# Patient Record
Sex: Male | Born: 1955 | Race: White | Hispanic: No | Smoking: Former smoker
Health system: Southern US, Community
[De-identification: ages and names within clinical notes are randomized; demographics above are authoritative.]

---

## 2016-10-06 ENCOUNTER — Emergency Department (HOSPITAL_COMMUNITY)
Admission: EM | Admit: 2016-10-06 | Discharge: 2016-10-06 | Disposition: A | Discharge status: Home / Self Care | Payer: BLUE CROSS/BLUE SHIELD | Attending: Emergency Medicine | Admitting: Emergency Medicine

## 2016-10-06 ENCOUNTER — Encounter (HOSPITAL_COMMUNITY): Payer: Self-pay

## 2016-10-06 ENCOUNTER — Emergency Department (HOSPITAL_COMMUNITY): Payer: BLUE CROSS/BLUE SHIELD

## 2016-10-06 DIAGNOSIS — R1084 Generalized abdominal pain: Secondary | ICD-10-CM | POA: Insufficient documentation

## 2016-10-06 DIAGNOSIS — Z87891 Personal history of nicotine dependence: Secondary | ICD-10-CM | POA: Insufficient documentation

## 2016-10-06 DIAGNOSIS — R112 Nausea with vomiting, unspecified: Secondary | ICD-10-CM | POA: Diagnosis not present

## 2016-10-06 LAB — CBC
HEMATOCRIT: 40.6 % (ref 39.0–52.0)
HEMOGLOBIN: 13.5 g/dL (ref 13.0–17.0)
MCH: 29 pg (ref 26.0–34.0)
MCHC: 33.3 g/dL (ref 30.0–36.0)
MCV: 87.1 fL (ref 78.0–100.0)
Platelets: 224 10*3/uL (ref 150–400)
RBC: 4.66 MIL/uL (ref 4.22–5.81)
RDW: 12.6 % (ref 11.5–15.5)
WBC: 6.6 10*3/uL (ref 4.0–10.5)

## 2016-10-06 LAB — COMPREHENSIVE METABOLIC PANEL
ALBUMIN: 3.6 g/dL (ref 3.5–5.0)
ALT: 14 U/L — ABNORMAL LOW (ref 17–63)
ANION GAP: 5 (ref 5–15)
AST: 19 U/L (ref 15–41)
Alkaline Phosphatase: 67 U/L (ref 38–126)
BUN: 14 mg/dL (ref 6–20)
CHLORIDE: 111 mmol/L (ref 101–111)
CO2: 25 mmol/L (ref 22–32)
Calcium: 8.6 mg/dL — ABNORMAL LOW (ref 8.9–10.3)
Creatinine, Ser: 0.9 mg/dL (ref 0.61–1.24)
GFR calc Af Amer: >60 mL/min (ref >60)
GFR calc non Af Amer: >60 mL/min (ref >60)
GLUCOSE: 122 mg/dL — AB (ref 65–99)
POTASSIUM: 3.7 mmol/L (ref 3.5–5.1)
SODIUM: 141 mmol/L (ref 135–145)
Total Bilirubin: 0.5 mg/dL (ref 0.3–1.2)
Total Protein: 6.2 g/dL — ABNORMAL LOW (ref 6.5–8.1)

## 2016-10-06 LAB — URINALYSIS, ROUTINE W REFLEX MICROSCOPIC
Bilirubin Urine: NEGATIVE
Glucose, UA: NEGATIVE mg/dL
HGB URINE DIPSTICK: NEGATIVE
KETONES UR: NEGATIVE mg/dL
Nitrite: NEGATIVE
PROTEIN: NEGATIVE mg/dL
Specific Gravity, Urine: 1.026 (ref 1.005–1.030)
pH: 5 (ref 5.0–8.0)

## 2016-10-06 LAB — POC OCCULT BLOOD, ED: FECAL OCCULT BLD: NEGATIVE

## 2016-10-06 LAB — LIPASE, BLOOD: LIPASE: 29 U/L (ref 11–51)

## 2016-10-06 MED ORDER — PANTOPRAZOLE SODIUM 40 MG IV SOLR
40.0000 mg | Freq: Once | INTRAVENOUS | Status: AC
  Administered 2016-10-06: 40 mg via INTRAVENOUS
  Filled 2016-10-06: qty 40

## 2016-10-06 MED ORDER — ONDANSETRON 8 MG PO TBDP: 8.0000 mg | ORAL_TABLET | Freq: Three times a day (TID) | ORAL | 0 refills | Status: AC | PRN

## 2016-10-06 MED ORDER — IOPAMIDOL (ISOVUE-300) INJECTION 61%
INTRAVENOUS | Status: AC
  Administered 2016-10-06: 100 mL
  Filled 2016-10-06: qty 100

## 2016-10-06 MED ORDER — METOCLOPRAMIDE HCL 5 MG/ML IJ SOLN
10.0000 mg | Freq: Once | INTRAMUSCULAR | Status: AC
  Administered 2016-10-06: 10 mg via INTRAVENOUS
  Filled 2016-10-06: qty 2

## 2016-10-06 MED ORDER — FAMOTIDINE IN NACL 20-0.9 MG/50ML-% IV SOLN
20.0000 mg | Freq: Once | INTRAVENOUS | Status: AC
  Administered 2016-10-06: 20 mg via INTRAVENOUS
  Filled 2016-10-06: qty 50

## 2016-10-06 MED ORDER — OMEPRAZOLE 20 MG PO CPDR: 20.0000 mg | DELAYED_RELEASE_CAPSULE | Freq: Every day | ORAL | 0 refills | Status: AC

## 2016-10-06 MED ORDER — GI COCKTAIL ~~LOC~~
30.0000 mL | Freq: Once | ORAL | Status: AC
  Administered 2016-10-06: 30 mL via ORAL
  Filled 2016-10-06: qty 30

## 2016-10-06 NOTE — Discharge Instructions (Signed)
Your abdominal pain is likely from gastritis, reflux or a stomach ulcer. You will need to take the prescribed proton pump inhibitor as directed, and avoid spicy/fatty/acidic foods. Avoid laying down flat within 30 minutes of eating. Avoid NSAIDs like ibuprofen or Aleve on an empty stomach. Use zofran as needed for nausea. Follow up with the gastroenterologist (GI doctor) listed for ongoing evaluation of your abdominal pain. Return to the ER for new or worsening symptoms, any additional concers. ° ° °SEEK IMMEDIATE MEDICAL ATTENTION IF YOU DEVELOP ANY OF THE FOLLOWING SYMPTOMS: °The pain does not go away or becomes severe.  °A temperature above 101 develops.  °Repeated vomiting occurs (multiple episodes).  °Blood is being passed in stools or vomit (bright red or black tarry stools).  °Return also if you develop chest pain, difficulty breathing, dizziness or fainting ° °

## 2016-10-06 NOTE — ED Notes (Signed)
ED Provider at bedside. 

## 2016-10-06 NOTE — ED Triage Notes (Signed)
PT from home states that on Saturday he woke up with upset stomach and has been vomiting some brown, black emesis. Pt has not taken anything for the nausea.

## 2016-10-06 NOTE — ED Notes (Signed)
Awaiting GI consult/ Pt to remain NPO until GI consult.

## 2016-10-06 NOTE — ED Provider Notes (Signed)
[facility]-EMERGENCY DEPT Provider Note   CSN: [id number] Arrival date & time: 10/06/16  0865     History   Chief Complaint Chief Complaint  Patient presents with  . Emesis    HPI Logan Lawrence is a 61 y.o. male.  HPI 61 year old Caucasian male with no significant past medical history presents to the emergency Department today with complaints of coffee ground emesis. Patient states that over the weekend he ate hot dogs and hamburgers. States that yesterday he started developing generalized abdominal bloating and gas pains. States that he was taking over-the-counter gas medication with little relief. States that yesterday he had 5 or 6 episodes of emesis. States that it was dark brown to black in nature. Denies any frank red blood. Patient continues to endorse generalized bloating but denies any focal abdominal pain. Denies any emesis this a.m. Patient denies any frequent NSAID use, aspirin use. Denies any chronic alcohol use. Denies a history of H. pylori. Has never seen a GI doctor nor had a colonoscopy or endoscopy performed. Nothing makes the pain better or worse.   Pt denies any fever, chill, ha, vision changes, lightheadedness, dizziness, congestion, neck pain, cp, sob, cough, urinary symptoms, change in bowel habits, melena, hematochezia, lower extremity paresthesias.  History reviewed. No pertinent past medical history.  There are no active problems to display for this patient.   History reviewed. No pertinent surgical history.     Home Medications    Prior to Admission medications   Not on File    Family History History reviewed. No pertinent family history.  Social History Social History  Substance Use Topics  . Smoking status: Former Games developer  . Smokeless tobacco: Never Used     Comment: quit 1991  . Alcohol use No     Allergies   Patient has no allergy information on record.   Review of Systems Review of Systems  Constitutional: Negative for chills and fever.   HENT: Negative for congestion and sore throat.   Eyes: Negative for visual disturbance.  Respiratory: Negative for cough and shortness of breath.   Cardiovascular: Negative for chest pain.  Gastrointestinal: Positive for abdominal pain, nausea and vomiting. Negative for blood in stool, constipation and diarrhea.  Genitourinary: Negative for dysuria, flank pain, frequency, hematuria, scrotal swelling, testicular pain and urgency.  Musculoskeletal: Negative for arthralgias and myalgias.  Skin: Negative for rash.  Neurological: Negative for dizziness, syncope, weakness, light-headedness, numbness and headaches.  Psychiatric/Behavioral: Negative for sleep disturbance. The patient is not nervous/anxious.      Physical Exam Updated Vital Signs BP 120/83   Pulse 60   Temp 98.9 F (37.2 C) (Oral)   Resp 16   SpO2 97%   Physical Exam  Constitutional: He is oriented to person, place, and time. He appears well-developed and well-nourished.  Non-toxic appearance. No distress.  HENT:  Head: Normocephalic and atraumatic.  Mouth/Throat: Oropharynx is clear and moist.  Eyes: Pupils are equal, round, and reactive to light. Conjunctivae are normal. Right eye exhibits no discharge. Left eye exhibits no discharge.  Neck: Normal range of motion. Neck supple.  Cardiovascular: Normal rate, regular rhythm, normal heart sounds and intact distal pulses.  Exam reveals no gallop and no friction rub.   No murmur heard. Pulmonary/Chest: Effort normal and breath sounds normal. No respiratory distress. He has no wheezes. He has no rales. He exhibits no tenderness.  Abdominal: Soft. Bowel sounds are normal. He exhibits no distension. There is generalized tenderness. There is no rigidity, no rebound,  no guarding, no CVA tenderness, no tenderness at McBurney's point and negative Murphy's sign.  Genitourinary:  Genitourinary Comments: Chaperone present for exam. Pt tolerated without difficulty. No external  hemorrhoids or fissures noted. No pain with palpation of the rectal vault. No internal hemorrhoids noted. Soft brown stool noted in the rectal vault. No gross hematochezia or melena. Hemoccult neg.   Musculoskeletal: Normal range of motion. He exhibits no tenderness.  Lymphadenopathy:    He has no cervical adenopathy.  Neurological: He is alert and oriented to person, place, and time.  Skin: Skin is warm and dry. Capillary refill takes less than 2 seconds. No rash noted.  Psychiatric: His behavior is normal. Judgment and thought content normal.  Nursing note and vitals reviewed.    ED Treatments / Results  Labs (all labs ordered are listed, but only abnormal results are displayed) Labs Reviewed  CBC  LIPASE, BLOOD  COMPREHENSIVE METABOLIC PANEL  URINALYSIS, ROUTINE W REFLEX MICROSCOPIC  POC OCCULT BLOOD, ED    EKG  EKG Interpretation None       Radiology Ct Abdomen Pelvis W Contrast  Result Date: 10/06/2016 CLINICAL DATA:  Abdominal pain, nausea, vomiting and diarrhea for 2 days. EXAM: CT ABDOMEN AND PELVIS WITH CONTRAST TECHNIQUE: Multidetector CT imaging of the abdomen and pelvis was performed using the standard protocol following bolus administration of intravenous contrast. CONTRAST:  100 ml ISOVUE-300 IOPAMIDOL (ISOVUE-300) INJECTION 61% COMPARISON:  None. FINDINGS: Lower chest: Mild dependent atelectasis is seen in the lung bases. No pleural or pericardial effusion. There is thickening of the walls of the distal esophagus. Several small lymph nodes are present about the distal esophagus measuring up to 0.8 cm short axis dimension as seen on image 14. Hepatobiliary: No focal liver lesion or biliary ductal dilatation. The liver is mildly low attenuating consistent with fatty infiltration. The gallbladder appears normal. Pancreas: Unremarkable. No pancreatic ductal dilatation or surrounding inflammatory changes. Spleen: Normal in size without focal abnormality. Adrenals/Urinary  Tract: Adrenal glands are unremarkable. Kidneys are normal, without renal calculi, focal lesion, or hydronephrosis. Bladder is unremarkable. Stomach/Bowel: Stomach is within normal limits. Appendix appears normal. No evidence of bowel wall thickening, distention, or inflammatory changes. Vascular/Lymphatic: Negative for atherosclerosis. No pathologically enlarged lymph nodes in the abdomen or pelvis. Reproductive: Prostate is unremarkable. Other: No fluid collection. Small fat containing inguinal hernias are identified, larger on the left. Musculoskeletal: Negative. IMPRESSION: Thickening of the walls of the distal esophagus with small periesophageal lymph nodes may be due to inflammatory change but endoscopy is recommended to exclude neoplasm. Fatty infiltration of the liver. Electronically Signed   By: Drusilla Kannerhomas  Dalessio M.D.   On: 10/06/2016 09:00    Procedures Procedures (including critical care time)  Medications Ordered in ED Medications  famotidine (PEPCID) IVPB 20 mg premix (not administered)  pantoprazole (PROTONIX) injection 40 mg (not administered)  metoCLOPramide (REGLAN) injection 10 mg (not administered)     Initial Impression / Assessment and Plan / ED Course  I have reviewed the triage vital signs and the nursing notes.  Pertinent labs & imaging results that were available during my care of the patient were reviewed by me and considered in my medical decision making (see chart for details).     Patient presents to the ED with complaints of abdominal bloating, generalized abdominal pain, dark emesis. Patient symptoms have been ongoing for the past day. Patient denies any prior history of same.  On exam patient is overall well-appearing and nontoxic. Vital signs are reassuring. Patient  is afebrile, no tachycardia, hypotension.  Patient without any focal abdominal tenderness on exam. No signs of peritonitis. Physical exam is reassuring.  Patient's pain and other symptoms  adequately managed in emergency department.  Labs, imaging and vitals reviewed.  Hemoglobin, creatinine, BUN are normal. Normal lipase. UA shows no signs of infection. Hemoccult is negative.    Patient does not meet the SIRS or Sepsis criteria.  On repeat exam patient does not have a surgical abdomin and there are no peritoneal signs.  No indication of appendicitis, bowel obstruction, bowel perforation, cholecystitis, diverticulitis. Tolerating po fluids and food without difficulties.   CT scan was obtained due to coffee-ground emesis. Does note some distal esophageal wall thickening consistent with inflammation but cannot exclude neoplasm. Recommends endoscopy in the outpatient setting.  Spoke with Dr. Marca Ancona with [facility] concerning pt. she recommends patient be placed on PPI and outpatient follow-up endoscopy in the office.  Patient discharged home with symptomatic treatment and given strict instructions for follow-up with their primary care physician and GI.  I have also discussed reasons to return immediately to the ER.  Patient expresses understanding and agrees with plan.     Final Clinical Impressions(s) / ED Diagnoses   Final diagnoses:  Generalized abdominal pain  Non-intractable vomiting with nausea, unspecified vomiting type    New Prescriptions New Prescriptions   OMEPRAZOLE (PRILOSEC) 20 MG CAPSULE    Take 1 capsule (20 mg total) by mouth daily.   ONDANSETRON (ZOFRAN-ODT) 8 MG DISINTEGRATING TABLET    Take 1 tablet (8 mg total) by mouth every 8 (eight) hours as needed for nausea.     Rise Mu, PA-C 10/06/16 1003    Cathren Laine, MD 10/06/16 1024

## 2018-07-15 IMAGING — CT CT ABD-PELV W/ CM
2 of 5 series · 16 of 46 positions shown, 18 images · IV contrast (APPLIED)
Comparison: None.

CLINICAL DATA: Abdominal pain, nausea, vomiting and diarrhea for 2
days.

EXAM:
CT ABDOMEN AND PELVIS WITH CONTRAST
TECHNIQUE: Multidetector CT imaging of the abdomen and pelvis was performed
using the standard protocol following bolus administration of
intravenous contrast.
CONTRAST:  100 ml [id number] IOPAMIDOL (VAK0FI-B11) INJECTION 61%

[Series 3: abd/ pelvis 5.0 i30f 2 · axial · 0.82mm/px · z∈[+849,+1304]mm · 13 of 103 slices shown, 15 images]
[im 6/103  soft-tissue]
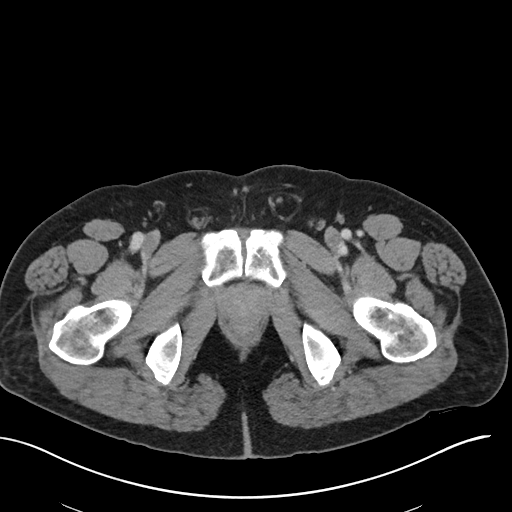
[im 6/103  bone]
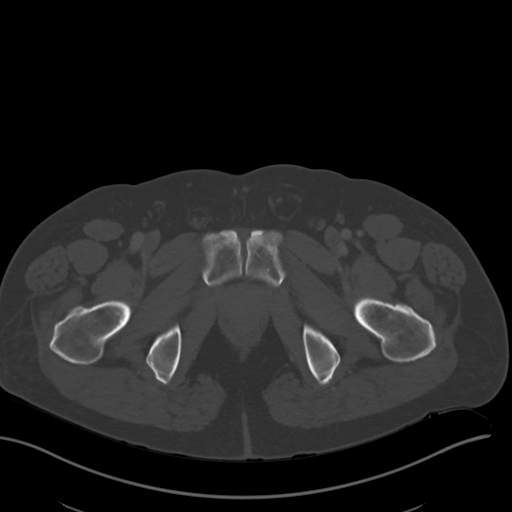
[im 12/103  soft-tissue]
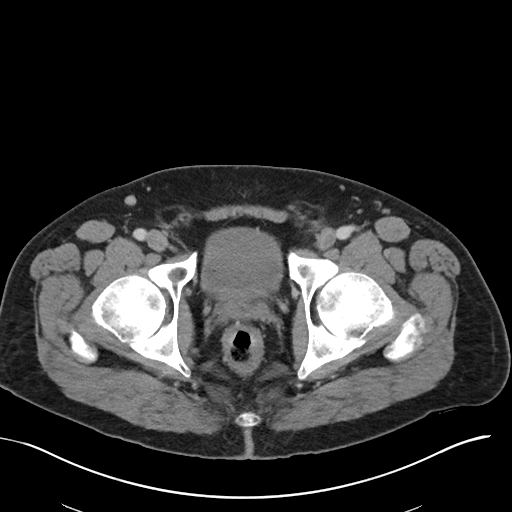
[im 23/103  soft-tissue]
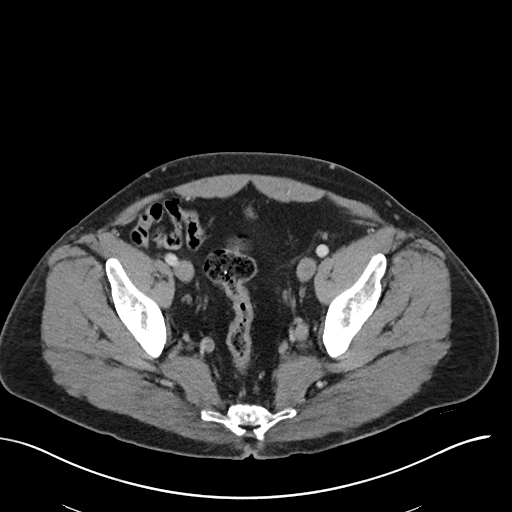
[im 29/103  soft-tissue]
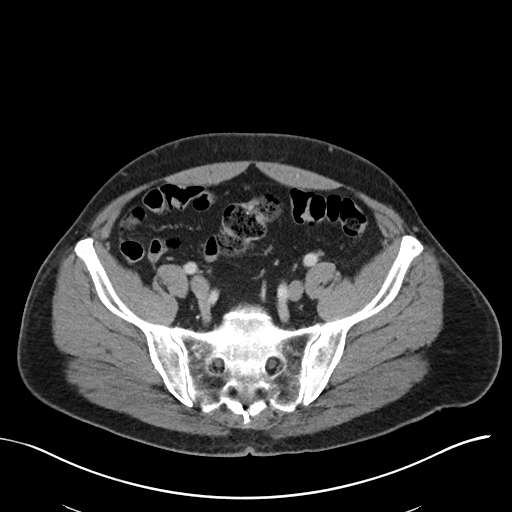
[im 35/103  soft-tissue]
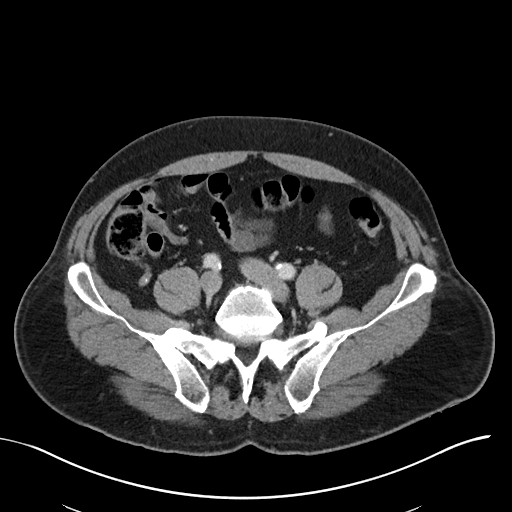
[im 46/103  soft-tissue]
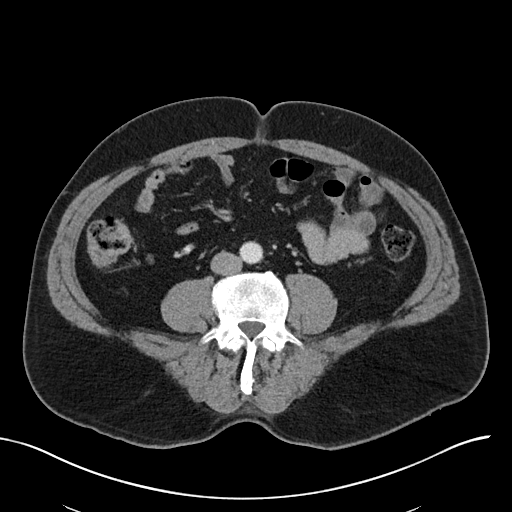
[im 52/103  soft-tissue]
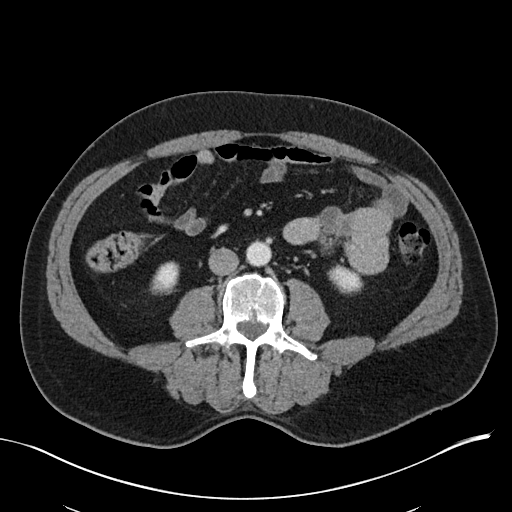
[im 57/103  soft-tissue]
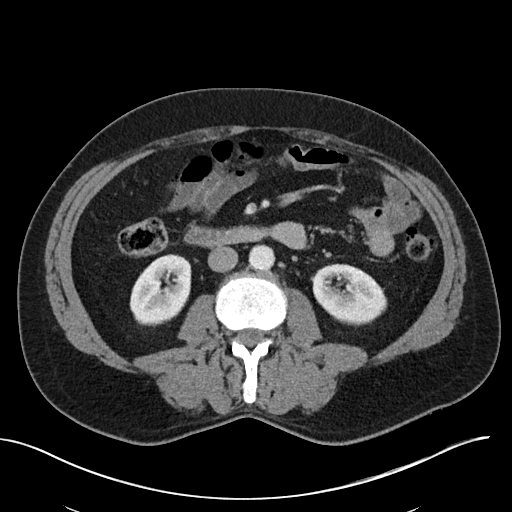
[im 69/103  soft-tissue]
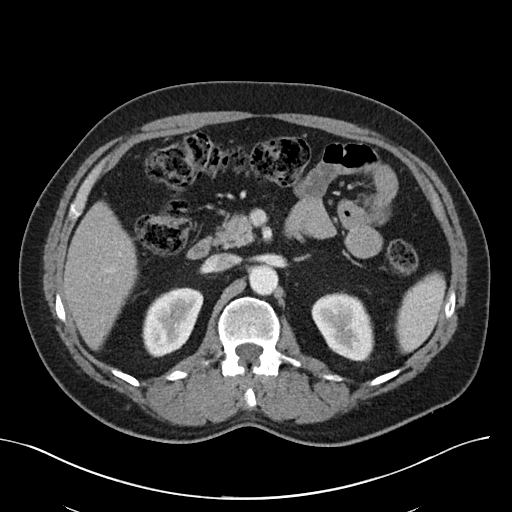
[im 69/103  bone]
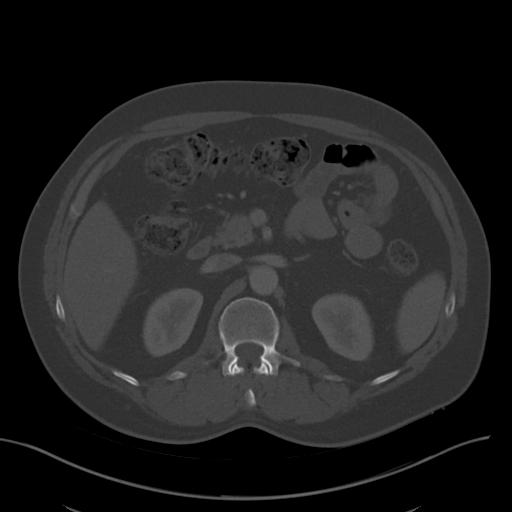
[im 74/103  soft-tissue]
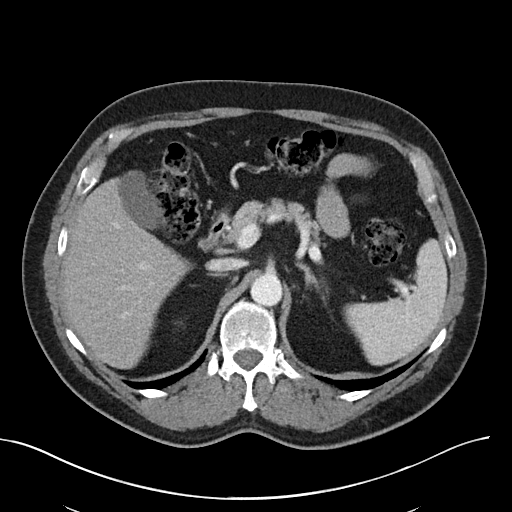
[im 80/103  soft-tissue]
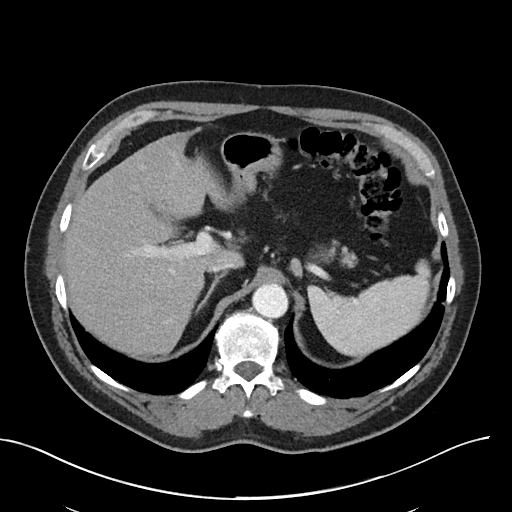
[im 91/103  soft-tissue]
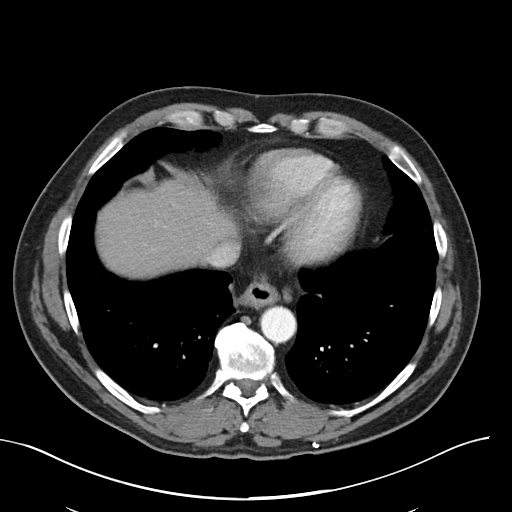
[im 97/103  soft-tissue]
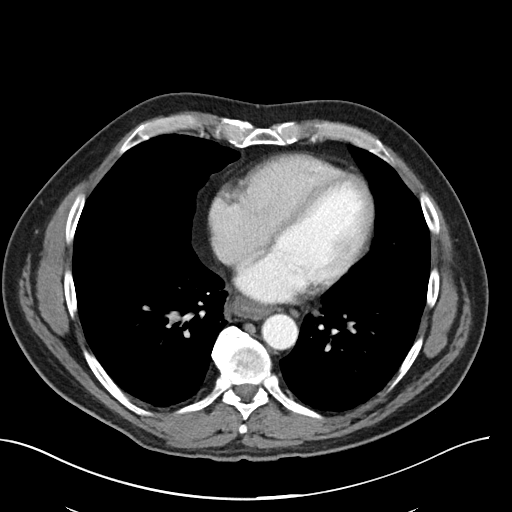

[Series 6: coronal soft tissue · coronal · 0.87mm/px · 3 of 101 slices shown]
[im 34/101  soft-tissue]
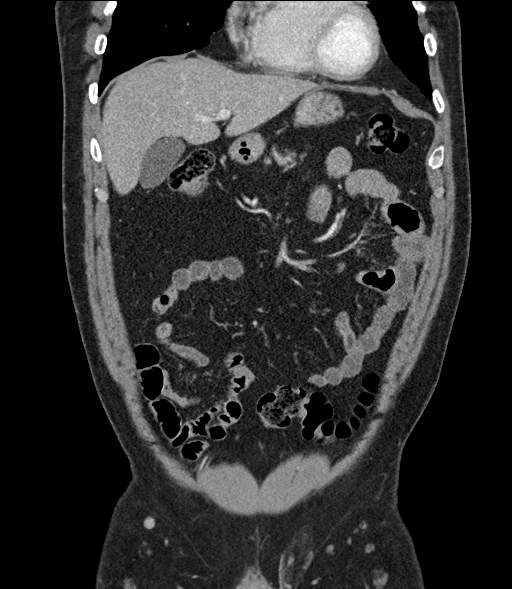
[im 45/101  soft-tissue]
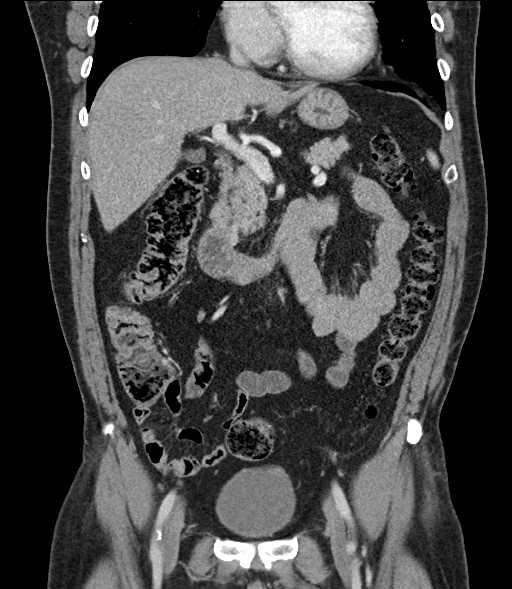
[im 56/101  soft-tissue]
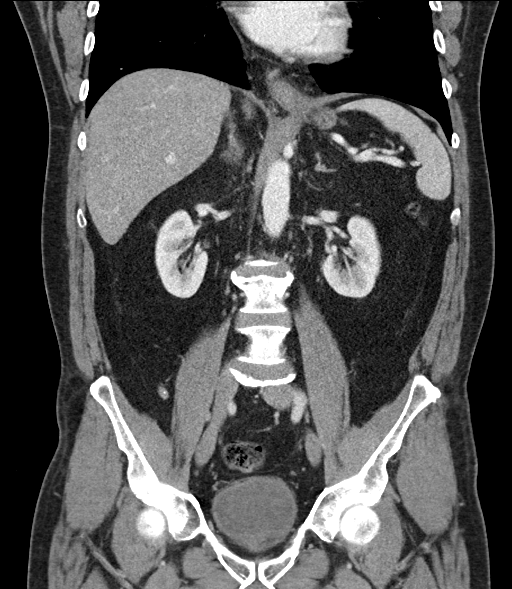

[16 of 46 positions shown; findings below may reference images not displayed]

FINDINGS: Lower chest: Mild dependent atelectasis is seen in the lung bases.
No pleural or pericardial effusion. There is thickening of the walls
of the distal esophagus. Several small lymph nodes are present about
the distal esophagus measuring up to 0.8 cm short axis dimension as
seen on image 14.

Hepatobiliary: No focal liver lesion or biliary ductal dilatation.
The liver is mildly low attenuating consistent with fatty
infiltration. The gallbladder appears normal.

Pancreas: Unremarkable. No pancreatic ductal dilatation or
surrounding inflammatory changes.

Spleen: Normal in size without focal abnormality.

Adrenals/Urinary Tract: Adrenal glands are unremarkable. Kidneys are
normal, without renal calculi, focal lesion, or hydronephrosis.
Bladder is unremarkable.

Stomach/Bowel: Stomach is within normal limits. Appendix appears
normal. No evidence of bowel wall thickening, distention, or
inflammatory changes.

Vascular/Lymphatic: Negative for atherosclerosis. No pathologically
enlarged lymph nodes in the abdomen or pelvis.

Reproductive: Prostate is unremarkable.

Other: No fluid collection. Small fat containing inguinal hernias
are identified, larger on the left.

Musculoskeletal: Negative.
IMPRESSION: Thickening of the walls of the distal esophagus with small
periesophageal lymph nodes may be due to inflammatory change but
endoscopy is recommended to exclude neoplasm.

Fatty infiltration of the liver.
